# Patient Record
Sex: Male | Born: 1940 | Race: White | Hispanic: No | Marital: Single | Smoking: Current every day smoker
Health system: Southern US, Community
[De-identification: ages and names within clinical notes are randomized; demographics above are authoritative.]

## PROBLEM LIST (undated history)

## (undated) DIAGNOSIS — K7460 Unspecified cirrhosis of liver: Secondary | ICD-10-CM

## (undated) DIAGNOSIS — K635 Polyp of colon: Secondary | ICD-10-CM

## (undated) DIAGNOSIS — K648 Other hemorrhoids: Secondary | ICD-10-CM

## (undated) DIAGNOSIS — B029 Zoster without complications: Secondary | ICD-10-CM

## (undated) DIAGNOSIS — K449 Diaphragmatic hernia without obstruction or gangrene: Secondary | ICD-10-CM

## (undated) DIAGNOSIS — M1990 Unspecified osteoarthritis, unspecified site: Secondary | ICD-10-CM

## (undated) DIAGNOSIS — K2940 Chronic atrophic gastritis without bleeding: Secondary | ICD-10-CM

## (undated) DIAGNOSIS — R188 Other ascites: Secondary | ICD-10-CM

## (undated) HISTORY — PX: INGUINAL HERNIA REPAIR: SUR1180

## (undated) HISTORY — DX: Other hemorrhoids: K64.8

## (undated) HISTORY — DX: Other ascites: R18.8

## (undated) HISTORY — DX: Diaphragmatic hernia without obstruction or gangrene: K44.9

## (undated) HISTORY — DX: Zoster without complications: B02.9

## (undated) HISTORY — DX: Unspecified osteoarthritis, unspecified site: M19.90

## (undated) HISTORY — DX: Chronic atrophic gastritis without bleeding: K29.40

## (undated) HISTORY — DX: Unspecified cirrhosis of liver: K74.60

## (undated) HISTORY — DX: Polyp of colon: K63.5

---

## 2004-02-11 DIAGNOSIS — K648 Other hemorrhoids: Secondary | ICD-10-CM

## 2004-02-11 DIAGNOSIS — K635 Polyp of colon: Secondary | ICD-10-CM

## 2004-02-11 DIAGNOSIS — K2940 Chronic atrophic gastritis without bleeding: Secondary | ICD-10-CM

## 2004-02-11 HISTORY — DX: Other hemorrhoids: K64.8

## 2004-02-11 HISTORY — DX: Polyp of colon: K63.5

## 2004-02-11 HISTORY — DX: Chronic atrophic gastritis without bleeding: K29.40

## 2004-04-02 ENCOUNTER — Ambulatory Visit: Payer: Self-pay | Admitting: Family Medicine

## 2004-04-16 ENCOUNTER — Ambulatory Visit: Payer: Self-pay | Admitting: Gastroenterology

## 2004-04-24 ENCOUNTER — Ambulatory Visit: Payer: Self-pay | Admitting: Gastroenterology

## 2004-05-15 ENCOUNTER — Ambulatory Visit: Payer: Self-pay | Admitting: Gastroenterology

## 2004-05-28 ENCOUNTER — Ambulatory Visit: Payer: Self-pay | Admitting: Gastroenterology

## 2004-06-17 ENCOUNTER — Ambulatory Visit: Payer: Self-pay | Admitting: Gastroenterology

## 2004-08-05 ENCOUNTER — Ambulatory Visit: Payer: Self-pay | Admitting: Gastroenterology

## 2009-04-02 ENCOUNTER — Telehealth: Payer: Self-pay | Admitting: Gastroenterology

## 2009-11-15 ENCOUNTER — Telehealth: Payer: Self-pay | Admitting: Gastroenterology

## 2010-01-28 ENCOUNTER — Emergency Department (HOSPITAL_COMMUNITY)
Admission: EM | Admit: 2010-01-28 | Discharge: 2010-01-28 | Payer: Self-pay | Source: Home / Self Care | Admitting: Family Medicine

## 2010-03-12 NOTE — Progress Notes (Signed)
Summary: Recall Colon  Phone Note Outgoing Call   Call placed by: Ashok Cordia RN,  April 02, 2009 3:41 PM Summary of Call: Chart for review.  Recall assessment for repeat colon.  Per Dr. Jarold Motto: Pt needs hemacult cards.  Discussed this with pt.  Pt states he will call back to set up a time to come for insturctions re stool collections. Initial call taken by: Ashok Cordia RN,  April 02, 2009 3:43 PM

## 2010-03-12 NOTE — Progress Notes (Signed)
Summary: Schedule IFOB  Phone Note Outgoing Call Call back at [facility] Phone [phone]   Call placed by: Harlow Mares CMA Duncan Dull),  November 15, 2009 3:27 PM Call placed to: Patient Summary of Call: per last phone note patient needs to do hemocults/IFOB i have left him a message to call back so I can order them.  Initial call taken by: Harlow Mares CMA Duncan Dull),  November 15, 2009 3:28 PM  Follow-up for Phone Call        Left a message on patients machine to call back. patietn needs Ifob per last phone note. I will mail a letter to let him know.  Follow-up by: Harlow Mares CMA Duncan Dull),  November 23, 2009 11:35 AM

## 2010-04-22 LAB — HERPES SIMPLEX VIRUS CULTURE: Culture: NOT DETECTED

## 2011-05-12 ENCOUNTER — Encounter: Payer: Self-pay | Admitting: Gastroenterology

## 2011-05-15 ENCOUNTER — Telehealth: Payer: Self-pay | Admitting: Gastroenterology

## 2011-05-15 NOTE — Telephone Encounter (Signed)
Chart on Dr Norval Gable desk.

## 2011-05-15 NOTE — Telephone Encounter (Signed)
Pt calls to report he received his Recall letter for his COLON, but he is taking care of his 71YO mother and he can't spare the time now. He requested the stool cards so he can get that out of the way. Explained to pt I have sent for his chart and will discuss the matter with Dr Jarold Motto and get back to him; he stated understanding.

## 2011-05-15 NOTE — Telephone Encounter (Signed)
Line busy, will try later.

## 2011-05-16 NOTE — Telephone Encounter (Signed)
Notified pt that Dr Jarold Motto rescheduled his COLON to next year; we will send him a letter. Pt stated understanding.

## 2012-05-25 ENCOUNTER — Encounter: Payer: Self-pay | Admitting: Gastroenterology

## 2012-10-10 ENCOUNTER — Ambulatory Visit (INDEPENDENT_AMBULATORY_CARE_PROVIDER_SITE_OTHER): Payer: Medicare PPO | Admitting: Family Medicine

## 2012-10-10 ENCOUNTER — Ambulatory Visit: Payer: Medicare PPO

## 2012-10-10 ENCOUNTER — Other Ambulatory Visit (HOSPITAL_COMMUNITY): Payer: Self-pay

## 2012-10-10 VITALS — BP 132/80 | HR 111 | Temp 98.8°F | Resp 18 | Ht 69.0 in | Wt 137.0 lb

## 2012-10-10 DIAGNOSIS — R609 Edema, unspecified: Secondary | ICD-10-CM

## 2012-10-10 DIAGNOSIS — R140 Abdominal distension (gaseous): Secondary | ICD-10-CM

## 2012-10-10 DIAGNOSIS — R141 Gas pain: Secondary | ICD-10-CM

## 2012-10-10 LAB — POCT URINALYSIS DIPSTICK
Blood, UA: NEGATIVE
Glucose, UA: NEGATIVE
Ketones, UA: 15
Leukocytes, UA: NEGATIVE
Nitrite, UA: NEGATIVE
Protein, UA: 30
Spec Grav, UA: 1.02
Urobilinogen, UA: 4
pH, UA: 7

## 2012-10-10 LAB — POCT UA - MICROSCOPIC ONLY
Crystals, Ur, HPF, POC: NEGATIVE
RBC, urine, microscopic: NEGATIVE
WBC, Ur, HPF, POC: NEGATIVE
Yeast, UA: NEGATIVE

## 2012-10-10 LAB — POCT CBC
Granulocyte percent: 76.4 %G (ref 37–80)
HCT, POC: 46 % (ref 43.5–53.7)
Hemoglobin: 15.1 g/dL (ref 14.1–18.1)
Lymph, poc: 1 (ref 0.6–3.4)
MCH, POC: 33.5 pg — AB (ref 27–31.2)
MCHC: 32.8 g/dL (ref 31.8–35.4)
MCV: 102.1 fL — AB (ref 80–97)
MID (cbc): 0.5 (ref 0–0.9)
MPV: 8.4 fL (ref 0–99.8)
POC Granulocyte: 4.5 (ref 2–6.9)
POC LYMPH PERCENT: 15.9 %L (ref 10–50)
POC MID %: 7.7 %M (ref 0–12)
Platelet Count, POC: 139 10*3/uL — AB (ref 142–424)
RBC: 4.51 M/uL — AB (ref 4.69–6.13)
RDW, POC: 14.9 %
WBC: 6.4 10*3/uL (ref 4.6–10.2)

## 2012-10-10 LAB — COMPREHENSIVE METABOLIC PANEL
ALT: 29 U/L (ref 0–53)
AST: 83 U/L — ABNORMAL HIGH (ref 0–37)
Albumin: 3.3 g/dL — ABNORMAL LOW (ref 3.5–5.2)
Alkaline Phosphatase: 137 U/L — ABNORMAL HIGH (ref 39–117)
BUN: 7 mg/dL (ref 6–23)
CO2: 30 mEq/L (ref 19–32)
Calcium: 8.7 mg/dL (ref 8.4–10.5)
Chloride: 100 mEq/L (ref 96–112)
Creat: 0.46 mg/dL — ABNORMAL LOW (ref 0.50–1.35)
Glucose, Bld: 117 mg/dL — ABNORMAL HIGH (ref 70–99)
Potassium: 4.4 mEq/L (ref 3.5–5.3)
Sodium: 137 mEq/L (ref 135–145)
Total Bilirubin: 1.5 mg/dL — ABNORMAL HIGH (ref 0.3–1.2)
Total Protein: 7.7 g/dL (ref 6.0–8.3)

## 2012-10-10 LAB — IFOBT (OCCULT BLOOD): IFOBT: NEGATIVE

## 2012-10-10 NOTE — Patient Instructions (Addendum)
CAT scan today. Start multiple vitamin (eg Centrum) daily Stop alcoholic beverages

## 2012-10-10 NOTE — Progress Notes (Signed)
Is a 72 year old gentleman who presents with abdominal distention and edema. He usually drinks 5-6 hard drinks a day and notices more than he should be drinking. He's had gradual distention of the abdomen associated with bowel urgency. This is been going on for over a month. Has had colonoscopy 3 years ago.  Objective: Alert and in no acute distress. Patient's muscle contour suggests chronic disease with loss of muscle mass. HEENT: No icterus or jaundice, patient has an upper plate, hearing is normal Neck: Supple no adenopathy or JVD Heart: Occasional irregular, no murmur or gallop Chest: Few bibasilar rales Abdomen: Moderate ascites with positive fluid wave, no liver or splenomegaly palpated. Liver percusses to about 6 cm in the mid clavicular line Extremities show 3+ edema bilaterally to a upper calf. He has good pulses in his feet. There no rashes [facility] reading (PRIMARY) by  Dr. Milus Glazier:  Acute abdominal series: .some dilated loops of bowel Results for orders placed in visit on 10/10/12  POCT CBC      Result Value Range   WBC 6.4  4.6 - 10.2 K/uL   Lymph, poc 1.0  0.6 - 3.4   POC LYMPH PERCENT 15.9  10 - 50 %L   MID (cbc) 0.5  0 - 0.9   POC MID % 7.7  0 - 12 %M   POC Granulocyte 4.5  2 - 6.9   Granulocyte percent 76.4  37 - 80 %G   RBC 4.51 (*) 4.69 - 6.13 M/uL   Hemoglobin 15.1  14.1 - 18.1 g/dL   HCT, POC 82.9  56.2 - 53.7 %   MCV 102.1 (*) 80 - 97 fL   MCH, POC 33.5 (*) 27 - 31.2 pg   MCHC 32.8  31.8 - 35.4 g/dL   RDW, POC 13.0     Platelet Count, POC 139 (*) 142 - 424 K/uL   MPV 8.4  0 - 99.8 fL  POCT UA - MICROSCOPIC ONLY      Result Value Range   WBC, Ur, HPF, POC neg     RBC, urine, microscopic neg     Bacteria, U Microscopic trace     Mucus, UA mod     Epithelial cells, urine per micros 0-2     Crystals, Ur, HPF, POC neg     Casts, Ur, LPF, POC broad waxy     Yeast, UA neg    POCT URINALYSIS DIPSTICK      Result Value Range   Color, UA orange     Clarity, UA  clear     Glucose, UA neg     Bilirubin, UA mod     Ketones, UA 15     Spec Grav, UA 1.020     Blood, UA neg     pH, UA 7.0     Protein, UA 30     Urobilinogen, UA 4.0     Nitrite, UA neg     Leukocytes, UA Negative     Assessment:  72 yo heavy drinker with bloating and diarrhea.    Plan:  Multiple vitamin CT scan today:  If no mass, start lasix 20 qd and recheck in a week.  Signed, Elvina Sidle, md  Addendum: Patient refuses both Lasix and CT scan at this point. He says he is to go home and get something to eat. I tried to persuade patient to have the CAT scan and point out that it may be some significant findings that could compromise his overall health,  but he prefers to go home and get the CAT scan later in the week.

## 2012-10-11 ENCOUNTER — Telehealth: Payer: Self-pay | Admitting: Radiology

## 2012-10-11 NOTE — Telephone Encounter (Signed)
Patient advised he will get a call about the scan tomorrow, he wants it scheduled now. I have advised him the scheduling office for [facility] is closed today for holiday. He states if he can not have it on Tuesday morning, he would like to go on Wednesday early morning instead. He needs to go early. Please

## 2012-10-11 NOTE — Telephone Encounter (Signed)
Patient is agreeable to CT scan, he prefers to go Tuesday early morning. Will you make sure this is taken care of? I am out of the office on Tuesday.

## 2012-10-12 ENCOUNTER — Telehealth: Payer: Self-pay

## 2012-10-12 NOTE — Telephone Encounter (Signed)
From OV note 10/10/12  "Plan: Multiple vitamin  CT scan today: If no mass, start lasix 20 qd and recheck in a week.  Signed,  Elvina Sidle, md  Addendum: Patient refuses both Lasix and CT scan at this point. He says he is to go home and get something to eat. I tried to persuade patient to have the CAT scan and point out that it may be some significant findings that could compromise his overall health, but he prefers to go home and get the CAT scan later in the week."  Has pt had CT?  Looks like Dr. Elbert Ewings wanted him to have this.  He previously refused the medication for swelling.  I can send this in for him, but pt will need to follow up in 1 wk as directed by Dr. Elbert Ewings in last note

## 2012-10-12 NOTE — Telephone Encounter (Signed)
PT STATES HE HAD SEEN THE DR AND WAS TO HAVE SOME MEDICINE CALLED IN FOR THE SWELLING OF HIS FEET AND THE PHARMACY HASN'T RECEIVED ANYTHING PLEASE CALL [phone]   [facility] [address]

## 2012-10-13 ENCOUNTER — Telehealth: Payer: Self-pay

## 2012-10-13 NOTE — Telephone Encounter (Signed)
Gerald Kennedy, she is trying to get precert for the scan.

## 2012-10-13 NOTE — Telephone Encounter (Signed)
Pt wants to know when the appointment for the scan will be.  Has contacted [place] previously wanting an early morning appointment.Pt is frustrated with the process. Was told to call and speak with Dr.L. Call at [phone].

## 2012-10-13 NOTE — Telephone Encounter (Signed)
Will you help him?

## 2012-10-14 NOTE — Telephone Encounter (Signed)
Ok great thanks - let's get the CT and see what that shows first, then can send in Lasix 20mg  qd as indicated by Dr. Elbert Ewings in his note.  Will need close follow up though

## 2012-10-14 NOTE — Telephone Encounter (Signed)
Where are we with scheduling of the CT scan?

## 2012-10-15 ENCOUNTER — Telehealth: Payer: Self-pay | Admitting: Radiology

## 2012-10-15 NOTE — Telephone Encounter (Signed)
The insurance company will not authorize the CT scan without patients past medical history, he is new to our office, and there is no documentation of his PMH in the computer. Do you recall from the office visit what his past history is? Please advise, so I can send the information to [facility] to get authorization for the CT scan. Please advise

## 2012-10-15 NOTE — Telephone Encounter (Signed)
Spoke with patient and notified CT has been cancelled per Amy. Waiting for past medical history approval from insurance. See previous message.

## 2012-10-16 ENCOUNTER — Telehealth: Payer: Self-pay

## 2012-10-16 NOTE — Telephone Encounter (Signed)
Pt called to talk about his CT scan. He wants to see if Dr Milus Glazier can call [facility] on Monday morning to help speed this process up. He is wanting to get this scan done asap. The number is [phone]

## 2012-10-18 ENCOUNTER — Telehealth: Payer: Self-pay

## 2012-10-18 ENCOUNTER — Ambulatory Visit (HOSPITAL_COMMUNITY): Payer: Medicare PPO

## 2012-10-18 NOTE — Telephone Encounter (Signed)
PT WAS TO GIVE AMY A CALL BACK TODAY AND HE CAN SPEAK WITH HER OR AMY. PLEASE CALL [phone]

## 2012-10-18 NOTE — Telephone Encounter (Signed)
Can you advise on his past medical history, and put an addendum to last visit so I can get authorization for his CT scan?

## 2012-10-18 NOTE — Telephone Encounter (Signed)
We need to make sure they know the information Dr Milus Glazier has presented. I am in a meeting tomorrow am, will you work on this?

## 2012-10-18 NOTE — Telephone Encounter (Signed)
Patient has ongoing diffuse abdominal pain and distension.  He has a h/o excessive alcohol use.

## 2012-10-21 ENCOUNTER — Other Ambulatory Visit: Payer: Self-pay | Admitting: Family Medicine

## 2012-10-21 ENCOUNTER — Ambulatory Visit
Admission: RE | Admit: 2012-10-21 | Discharge: 2012-10-21 | Disposition: A | Discharge status: Home / Self Care | Payer: Medicare PPO | Source: Ambulatory Visit | Attending: Family Medicine | Admitting: Family Medicine

## 2012-10-21 DIAGNOSIS — K7460 Unspecified cirrhosis of liver: Secondary | ICD-10-CM

## 2012-10-21 DIAGNOSIS — R188 Other ascites: Secondary | ICD-10-CM

## 2012-10-21 DIAGNOSIS — R140 Abdominal distension (gaseous): Secondary | ICD-10-CM

## 2012-10-21 MED ORDER — SPIRONOLACTONE 50 MG PO TABS: 50.0000 mg | ORAL_TABLET | Freq: Every day | ORAL | Status: DC

## 2012-10-21 MED ORDER — FUROSEMIDE 20 MG PO TABS: 20.0000 mg | ORAL_TABLET | Freq: Every day | ORAL | Status: DC

## 2012-10-21 MED ORDER — IOHEXOL 300 MG/ML  SOLN
100.0000 mL | Freq: Once | INTRAMUSCULAR | Status: AC | PRN
  Administered 2012-10-21: 100 mL via INTRAVENOUS

## 2012-10-22 NOTE — Telephone Encounter (Signed)
Pt would like to speak with you regarding an appt, when asked did he want to speak with the appt building to schedule an appt he stated that he only wanted to speak with you about this. [phone]

## 2012-10-25 ENCOUNTER — Ambulatory Visit (INDEPENDENT_AMBULATORY_CARE_PROVIDER_SITE_OTHER): Payer: Medicare PPO | Admitting: Family Medicine

## 2012-10-25 VITALS — BP 120/80 | HR 94 | Temp 98.1°F | Resp 16 | Ht 69.0 in | Wt 129.4 lb

## 2012-10-25 DIAGNOSIS — Z23 Encounter for immunization: Secondary | ICD-10-CM

## 2012-10-25 DIAGNOSIS — K7460 Unspecified cirrhosis of liver: Secondary | ICD-10-CM

## 2012-10-25 DIAGNOSIS — R188 Other ascites: Secondary | ICD-10-CM

## 2012-10-25 MED ORDER — FUROSEMIDE 20 MG PO TABS: 20.0000 mg | ORAL_TABLET | Freq: Every day | ORAL | Status: DC

## 2012-10-25 MED ORDER — SPIRONOLACTONE 50 MG PO TABS: 50.0000 mg | ORAL_TABLET | Freq: Every day | ORAL | Status: DC

## 2012-10-25 NOTE — Progress Notes (Signed)
72 yo ex-heavy drinker with cirrhosis who has ascites and esophageal varices.  He was prescribed fluid pills and has lost 8 lbs.  Objective:  NAD NEck:  No JVD Chest:  Clear Heart:  occas irreg, no murmur Abdomen:  Fluid wave.  No masses Extremity:  2+ pedal edema  Assessment:  Improved ascites  Plan:  GI consult  Continue the Centrum Continue the fluid pills  Signed,  Elvina Sidle, MD

## 2012-10-25 NOTE — Telephone Encounter (Signed)
Patient needs to talk with dr. Milus Glazier or his nurse regarding his liver problem.   [phone]

## 2012-10-25 NOTE — Telephone Encounter (Signed)
Patient advised. He wants to discuss with Dr Milus Glazier. He is going to come in [place].

## 2012-11-02 NOTE — Telephone Encounter (Signed)
CT was done on 10/21/12.

## 2012-11-05 ENCOUNTER — Encounter: Payer: Self-pay | Admitting: Gastroenterology

## 2012-11-24 ENCOUNTER — Encounter: Payer: Self-pay | Admitting: *Deleted

## 2012-12-02 ENCOUNTER — Encounter: Payer: Self-pay | Admitting: Gastroenterology

## 2012-12-02 ENCOUNTER — Other Ambulatory Visit (INDEPENDENT_AMBULATORY_CARE_PROVIDER_SITE_OTHER): Payer: Medicare PPO

## 2012-12-02 ENCOUNTER — Ambulatory Visit (INDEPENDENT_AMBULATORY_CARE_PROVIDER_SITE_OTHER): Payer: Medicare PPO | Admitting: Gastroenterology

## 2012-12-02 VITALS — BP 124/84 | HR 97 | Ht 67.75 in | Wt 124.2 lb

## 2012-12-02 DIAGNOSIS — R918 Other nonspecific abnormal finding of lung field: Secondary | ICD-10-CM

## 2012-12-02 DIAGNOSIS — J42 Unspecified chronic bronchitis: Secondary | ICD-10-CM

## 2012-12-02 DIAGNOSIS — F1010 Alcohol abuse, uncomplicated: Secondary | ICD-10-CM

## 2012-12-02 DIAGNOSIS — K7460 Unspecified cirrhosis of liver: Secondary | ICD-10-CM

## 2012-12-02 DIAGNOSIS — D7589 Other specified diseases of blood and blood-forming organs: Secondary | ICD-10-CM

## 2012-12-02 DIAGNOSIS — R9389 Abnormal findings on diagnostic imaging of other specified body structures: Secondary | ICD-10-CM

## 2012-12-02 DIAGNOSIS — K5900 Constipation, unspecified: Secondary | ICD-10-CM

## 2012-12-02 LAB — CBC WITH DIFFERENTIAL/PLATELET
Basophils Relative: 0.2 % (ref 0.0–3.0)
Eosinophils Absolute: 0.1 10*3/uL (ref 0.0–0.7)
Hemoglobin: 14.3 g/dL (ref 13.0–17.0)
Lymphs Abs: 1.2 10*3/uL (ref 0.7–4.0)
MCHC: 34.5 g/dL (ref 30.0–36.0)
MCV: 95.2 fl (ref 78.0–100.0)
Monocytes Absolute: 0.5 10*3/uL (ref 0.1–1.0)
Neutro Abs: 4.5 10*3/uL (ref 1.4–7.7)
RBC: 4.34 Mil/uL (ref 4.22–5.81)
RDW: 15.6 % — ABNORMAL HIGH (ref 11.5–14.6)

## 2012-12-02 LAB — HEPATIC FUNCTION PANEL
ALT: 19 U/L (ref 0–53)
AST: 42 U/L — ABNORMAL HIGH (ref 0–37)
Alkaline Phosphatase: 100 U/L (ref 39–117)
Total Bilirubin: 0.8 mg/dL (ref 0.3–1.2)

## 2012-12-02 LAB — BASIC METABOLIC PANEL
BUN: 6 mg/dL (ref 6–23)
Calcium: 9.5 mg/dL (ref 8.4–10.5)
Creatinine, Ser: 0.6 mg/dL (ref 0.4–1.5)
GFR: 135.5 mL/min (ref >60.00)

## 2012-12-02 LAB — TSH: TSH: 1.98 u[IU]/mL (ref 0.35–5.50)

## 2012-12-02 LAB — IBC PANEL: Saturation Ratios: 24.9 % (ref 20.0–50.0)

## 2012-12-02 MED ORDER — FOLIC ACID 1 MG PO TABS: 1.0000 mg | ORAL_TABLET | Freq: Every day | ORAL | Status: DC

## 2012-12-02 NOTE — Patient Instructions (Addendum)
You have been scheduled for an endoscopy with propofol. Please follow written instructions given to you at your visit today. If you use inhalers (even only as needed), please bring them with you on the day of your procedure. Your physician has requested that you go to www.startemmi.com and enter the access code given to you at your visit today. This web site gives a general overview about your procedure. However, you should still follow specific instructions given to you by our office regarding your preparation for the procedure.   Please purchase Miralax over the counter and mix 8 ounces in juice or water at bedtime.   Please purchase Benefiber over the counter and take one tablespoon by mouth twice daily.   We have sent the following medications to your pharmacy for you to pick up at your convenience: Folic Acid 1 mg, please take one tablet by mouth once daily   Please continue your dieretics.  Your physician has requested that you go to the basement for lab work before leaving today.

## 2012-12-02 NOTE — Progress Notes (Signed)
History of Present Illness:  This is a 72 year old chronic alcoholic there also is a chronic smoker with COPD who presented recently to his primary care physician with ascites.  He had CT scan of the abdomen September 11 that showed ascites in irregular nodular liver consistent with cirrhosis and possible hepatoma in the right lobe.  Placed on Lasix 40 mg a day and spironolactone 50 mg a day with resolution of his ascites.  His only other symptomatology his gas, bloating, and chronic constipation which i which has been present for many years.  He actually had sclerotherapy of his hemorrhoids by Dr. Lebron Conners in may of 2006.  Last colonoscopy and endoscopy was in 2006.  At that time he was positive for H. pylori gastritis and was treated with a Prevpac.  I have not seen this patient since 2006.  His ascites seemed to resolve and he denies abdominal pain, hematemesis, melena, or other specific gastrointestinal or general medical problems.  He does have COPD with limited exercise tolerance but denies a history of MIs or CVAs.  Is not on any anticoagulation agents or other medications.  He says he's quit drinking alcohol in his and is not going to AA.  He denies a previous history of hepatitis or family history of liver disease.  He denies psychiatric difficulties.  Recent hemoglobin was 15 with a normal indices except for an elevated MCV.  Liver function tests were consistent with alcoholic hepatitis with SGOT 83, SGPT 29, phosphatase 137, albumin 3.3 g percent and normal albumin.  I have reviewed this patient's present history, medical and surgical past history, allergies and medications.     ROS:   All systems were reviewed and are negative unless otherwise stated in the HPI.    Physical Exam: Blood pressure 124/84, pulse 97 and regular and weight 03/01/2012 919.83. General well developed well nourished patient in no acute distress, appearing their stated age Eyes PERRLA, no icterus, fundoscopic exam  per opthamologist Skin no lesions noted Neck supple, no adenopathy, no thyroid enlargement, no tenderness Chest diminished breath sounds in both lung fields with loud rhonchi anteriorly. Heart no significant murmurs, gallops or rubs noted Abdomen enlarged lobular right lobe of the liver which is nontender.  I cannot appreciate enlarged left lobe the liver, splenomegaly, or evidence of ascites or peripheral edema. Rectal inspection normal no fissures, or fistulae noted.  No masses or tenderness on digital exam. Stool guaiac negative.  Perianal and posterior skin tags noted without definite fissures or fistulae or any hemorrhoids.  Stool is normal color and guaiac-negative. Extremities no acute joint lesions, edema, phlebitis or evidence of cellulitis. Neurologic patient oriented x 3, cranial nerves intact, no focal neurologic deficits noted. Psychological mental status normal and normal affect.  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  Repeat labs ordered including hepatitis B. surface antigen, hepatitis C antibody, prothrombin time, alpha-fetoprotein, anemia profile, and repeat liver enzymes.  I've scheduled endoscopy to check for varices.  He probably will need MRI exam of his liver depending on his alpha-fetoprotein level.  I've strongly advised him to avoid alcohol at all costs.  He also has rather severe COPD will probably need pulmonary function testing if he is to be considered for liver transplantation in the future.  Last endoscopy in 2006 showed no evidence of varices, and he had a small hyperplastic colon polyp removed.  He probably has mild probably south anemia of related to his smoking and COPD.  So  ordered a day prothrombin time, ammonia level, and CDT.  For his constipation I placed him on twice a day Benefiber with his food and MiraLax at bedtime.  He may need elevation in his diuretic therapy depending on his clinical course.  Once this patient  is been worked up and stabilized I will turn him over to one of our physicians for long-term care since I will be retiring in several months.  Again, MRI may be needed and liver biopsy may be needed.

## 2012-12-02 NOTE — Addendum Note (Signed)
Addended by: Ok Anis A on: 12/02/2012 01:56 PM   Modules accepted: Orders

## 2012-12-03 LAB — AFP TUMOR MARKER: AFP-Tumor Marker: 5 ng/mL (ref 0.0–8.0)

## 2012-12-03 LAB — HEPATITIS B SURFACE ANTIGEN: Hepatitis B Surface Ag: NEGATIVE

## 2012-12-03 LAB — HEPATITIS C ANTIBODY: HCV Ab: NEGATIVE

## 2012-12-05 LAB — CARBOHYDRATE DEFICIENT TRANSFERRIN: CDT: 50.8 mg/L (ref 28.1–76.0)

## 2012-12-08 ENCOUNTER — Encounter: Payer: Self-pay | Admitting: Family Medicine

## 2012-12-08 ENCOUNTER — Ambulatory Visit (INDEPENDENT_AMBULATORY_CARE_PROVIDER_SITE_OTHER): Payer: Medicare PPO | Admitting: Family Medicine

## 2012-12-08 VITALS — BP 108/62 | HR 100 | Temp 98.3°F | Resp 16 | Ht 69.0 in | Wt 126.4 lb

## 2012-12-08 DIAGNOSIS — M1990 Unspecified osteoarthritis, unspecified site: Secondary | ICD-10-CM

## 2012-12-08 DIAGNOSIS — K7460 Unspecified cirrhosis of liver: Secondary | ICD-10-CM

## 2012-12-08 DIAGNOSIS — M129 Arthropathy, unspecified: Secondary | ICD-10-CM

## 2012-12-08 MED ORDER — PREDNISONE 10 MG PO TABS: ORAL_TABLET | ORAL | Status: DC

## 2012-12-08 NOTE — Progress Notes (Signed)
72 yo retired man with ascites and in the process of being evaluated by Dr. Jarold Motto.  His bowels have become more regular and he is going regularly since the fiber and miralax added to his regimen.  He has stopped drinking alcohol.  He complains of pain in fingers, toes.  Ibuprofen and other OTC NSAID's not helping  Objective:  NAD, somewhat cachectic Abdomen:  Markedly decreased swelling, some fullness in epigastrium, nontender, no guarding, no rebound, normal BS, no bruit Neck:  No adenop, supple, no thyromegaly, no bruit Chest:  Clear Heart:  Very occasionally ireg, no murmur Ext: no edema. Skin:  No jaundice  Results for orders placed in visit on 12/02/12  HEPATIC FUNCTION PANEL      Result Value Range   Total Bilirubin 0.8  0.3 - 1.2 mg/dL   Bilirubin, Direct 0.3  0.0 - 0.3 mg/dL   Alkaline Phosphatase 100  39 - 117 U/L   AST 42 (*) 0 - 37 U/L   ALT 19  0 - 53 U/L   Total Protein 8.4 (*) 6.0 - 8.3 g/dL   Albumin 3.5  3.5 - 5.2 g/dL  VITAMIN Z61      Result Value Range   Vitamin B-12 337  211 - 911 pg/mL  FERRITIN      Result Value Range   Ferritin 49.9  22.0 - 322.0 ng/mL  CBC WITH DIFFERENTIAL      Result Value Range   WBC 6.3  4.5 - 10.5 K/uL   RBC 4.34  4.22 - 5.81 Mil/uL   Hemoglobin 14.3  13.0 - 17.0 g/dL   HCT 09.6  04.5 - 40.9 %   MCV 95.2  78.0 - 100.0 fl   MCHC 34.5  30.0 - 36.0 g/dL   RDW 81.1 (*) 91.4 - 78.2 %   Platelets 133.0 (*) 150.0 - 400.0 K/uL   Neutrophils Relative % 72.2  43.0 - 77.0 %   Lymphocytes Relative 18.4  12.0 - 46.0 %   Monocytes Relative 7.7  3.0 - 12.0 %   Eosinophils Relative 1.5  0.0 - 5.0 %   Basophils Relative 0.2  0.0 - 3.0 %   Neutro Abs 4.5  1.4 - 7.7 K/uL   Lymphs Abs 1.2  0.7 - 4.0 K/uL   Monocytes Absolute 0.5  0.1 - 1.0 K/uL   Eosinophils Absolute 0.1  0.0 - 0.7 K/uL   Basophils Absolute 0.0  0.0 - 0.1 K/uL  BASIC METABOLIC PANEL      Result Value Range   Sodium 135  135 - 145 mEq/L   Potassium 3.8  3.5 - 5.1 mEq/L    Chloride 98  96 - 112 mEq/L   CO2 28  19 - 32 mEq/L   Glucose, Bld 105 (*) 70 - 99 mg/dL   BUN 6  6 - 23 mg/dL   Creatinine, Ser 0.6  0.4 - 1.5 mg/dL   Calcium 9.5  8.4 - 95.6 mg/dL   GFR 213.08  >65.78 mL/min  TSH      Result Value Range   TSH 1.98  0.35 - 5.50 uIU/mL  FOLATE      Result Value Range   Folate 9.7  >5.9 ng/mL  IBC PANEL      Result Value Range   Iron 95  42 - 165 ug/dL   Transferrin 469.6  295.2 - 360.0 mg/dL   Saturation Ratios 84.1  20.0 - 50.0 %  PROTIME-INR      Result Value  Range   Prothrombin Time 13.3 (*) 10.2 - 12.4 sec   INR 1.3 (*) 0.8 - 1.0 ratio  HEPATITIS B SURFACE ANTIGEN      Result Value Range   Hepatitis B Surface Ag NEGATIVE  NEGATIVE  HEPATITIS C ANTIBODY      Result Value Range   HCV Ab NEGATIVE  NEGATIVE  AFP TUMOR MARKER      Result Value Range   AFP-Tumor Marker 5.0  0.0 - 8.0 ng/mL  AMMONIA      Result Value Range   Ammonia 8 (*) 11 - 35 umol/L  CARBOHYDRATE DEFICIENT TRANSFERRIN      Result Value Range   CDT 50.8  28.1 - 76.0 mg/L   Transferrin 267  188 - 341 mg/dL   %CDT 1.9  <1.6 %   Assessment:  Impressive improvement in ascites, mild to moderate osteoarthritis.  Overall, patient has stabilized and the liver function is improving.  He is compliant and avoiding alcohol, so I am optimistic about his chances for recovery.  Arthritis - Plan: predniSONE (DELTASONE) 10 MG tablet  Cirrhosis of liver  Follow up with GI for endoscopy, return here in January.  Signed, Elvina Sidle, MD

## 2012-12-08 NOTE — Patient Instructions (Signed)
Results for orders placed in visit on 12/02/12  HEPATIC FUNCTION PANEL      Result Value Range   Total Bilirubin 0.8  0.3 - 1.2 mg/dL   Bilirubin, Direct 0.3  0.0 - 0.3 mg/dL   Alkaline Phosphatase 100  39 - 117 U/L   AST 42 (*) 0 - 37 U/L   ALT 19  0 - 53 U/L   Total Protein 8.4 (*) 6.0 - 8.3 g/dL   Albumin 3.5  3.5 - 5.2 g/dL  VITAMIN I69      Result Value Range   Vitamin B-12 337  211 - 911 pg/mL  FERRITIN      Result Value Range   Ferritin 49.9  22.0 - 322.0 ng/mL  CBC WITH DIFFERENTIAL      Result Value Range   WBC 6.3  4.5 - 10.5 K/uL   RBC 4.34  4.22 - 5.81 Mil/uL   Hemoglobin 14.3  13.0 - 17.0 g/dL   HCT 62.9  52.8 - 41.3 %   MCV 95.2  78.0 - 100.0 fl   MCHC 34.5  30.0 - 36.0 g/dL   RDW 24.4 (*) 01.0 - 27.2 %   Platelets 133.0 (*) 150.0 - 400.0 K/uL   Neutrophils Relative % 72.2  43.0 - 77.0 %   Lymphocytes Relative 18.4  12.0 - 46.0 %   Monocytes Relative 7.7  3.0 - 12.0 %   Eosinophils Relative 1.5  0.0 - 5.0 %   Basophils Relative 0.2  0.0 - 3.0 %   Neutro Abs 4.5  1.4 - 7.7 K/uL   Lymphs Abs 1.2  0.7 - 4.0 K/uL   Monocytes Absolute 0.5  0.1 - 1.0 K/uL   Eosinophils Absolute 0.1  0.0 - 0.7 K/uL   Basophils Absolute 0.0  0.0 - 0.1 K/uL  BASIC METABOLIC PANEL      Result Value Range   Sodium 135  135 - 145 mEq/L   Potassium 3.8  3.5 - 5.1 mEq/L   Chloride 98  96 - 112 mEq/L   CO2 28  19 - 32 mEq/L   Glucose, Bld 105 (*) 70 - 99 mg/dL   BUN 6  6 - 23 mg/dL   Creatinine, Ser 0.6  0.4 - 1.5 mg/dL   Calcium 9.5  8.4 - 53.6 mg/dL   GFR 644.03  >47.42 mL/min  TSH      Result Value Range   TSH 1.98  0.35 - 5.50 uIU/mL  FOLATE      Result Value Range   Folate 9.7  >5.9 ng/mL  IBC PANEL      Result Value Range   Iron 95  42 - 165 ug/dL   Transferrin 595.6  387.5 - 360.0 mg/dL   Saturation Ratios 64.3  20.0 - 50.0 %  PROTIME-INR      Result Value Range   Prothrombin Time 13.3 (*) 10.2 - 12.4 sec   INR 1.3 (*) 0.8 - 1.0 ratio  HEPATITIS B SURFACE ANTIGEN      Result Value Range   Hepatitis B Surface Ag NEGATIVE  NEGATIVE  HEPATITIS C ANTIBODY      Result Value Range   HCV Ab NEGATIVE  NEGATIVE  AFP TUMOR MARKER      Result Value Range   AFP-Tumor Marker 5.0  0.0 - 8.0 ng/mL  AMMONIA      Result Value Range   Ammonia 8 (*) 11 - 35 umol/L  CARBOHYDRATE DEFICIENT TRANSFERRIN  Result Value Range   CDT 50.8  28.1 - 76.0 mg/L   Transferrin 267  188 - 341 mg/dL   %CDT 1.9  <4.0 %

## 2012-12-13 ENCOUNTER — Telehealth: Payer: Self-pay | Admitting: Gastroenterology

## 2012-12-13 NOTE — Telephone Encounter (Signed)
No ...set f/u with AMY

## 2012-12-14 NOTE — Telephone Encounter (Signed)
He needs some point MRI of his abdomen, but is unlikely that he has a hepatoma with a normal alpha-fetoprotein. Review of his CT scan does show that he has varices and ascites. I should transfer this patient to Dr. Rhea Belton since he is going to need long-term care and possibly variceal banding. Send a copy this note to Dr. Faustino Congress.

## 2012-12-14 NOTE — Telephone Encounter (Signed)
OK to you; will you accept the pt? Thanks.

## 2012-12-14 NOTE — Telephone Encounter (Signed)
Spoke with pt to r/s an appt and he stated he was sorry he had to cancel, but he has too much on his plate. Informed him he will not be charged for cancelling the procedure and call me back when he is ready to schedule. Dr Jarold Motto, pt states Dr Milus Glazier wants to talk to you about pt.

## 2012-12-14 NOTE — Telephone Encounter (Signed)
Yes, I accept, but would get patient in to see Amy 1st, then can follow with me thereafter (this should be quicker than awaiting on my next available appt) Thanks

## 2012-12-14 NOTE — Telephone Encounter (Signed)
No ,,,f/u with Amy

## 2012-12-15 ENCOUNTER — Encounter: Payer: Medicare PPO | Admitting: Gastroenterology

## 2012-12-30 ENCOUNTER — Telehealth: Payer: Self-pay

## 2012-12-30 DIAGNOSIS — M1990 Unspecified osteoarthritis, unspecified site: Secondary | ICD-10-CM

## 2012-12-30 MED ORDER — MELOXICAM 7.5 MG PO TABS: 7.5000 mg | ORAL_TABLET | Freq: Every day | ORAL | Status: DC

## 2012-12-30 NOTE — Telephone Encounter (Signed)
What medication is he requesting? He is asking for prednisone, but I have advised him it is not a good idea to repeat course of this due to side effects. He indicates he is using acetaminophen, advised him with his liver he should not take this. Advised him I will ask Dr Milus Glazier to recommend an NSAID for him, please advise. He indicates he has arthritis pains, worse with the cold.

## 2012-12-30 NOTE — Telephone Encounter (Signed)
Patient would like to know if he can have a refill on his arthritis medication (he knows he has no refills) please call patient at [phone]

## 2012-12-31 NOTE — Telephone Encounter (Signed)
Informed pt Dr Margretta Sidle is glad to accept him as a new pt. Pt states he can't schedule right now. Informed him to call when he wants to be seen and if Dr Rhea Belton doesn't have an appt, he may see an extender. Pt stated understanding.

## 2012-12-31 NOTE — Telephone Encounter (Signed)
Pt advised Meloxicam sent in for him

## 2013-01-17 ENCOUNTER — Telehealth: Payer: Self-pay

## 2013-01-17 DIAGNOSIS — R188 Other ascites: Secondary | ICD-10-CM

## 2013-01-17 MED ORDER — SPIRONOLACTONE 50 MG PO TABS: 50.0000 mg | ORAL_TABLET | Freq: Every day | ORAL | Status: DC

## 2013-01-17 NOTE — Telephone Encounter (Signed)
PT STATES HE WOULD LIKE A REFILL ON HIS ALDACTONE, PLEASE CALL [phone] AND HE WASN'T SURE HOW TO CALL THE PHARMACY   [facility] [address]

## 2013-01-23 ENCOUNTER — Other Ambulatory Visit: Payer: Self-pay | Admitting: Family Medicine

## 2013-02-23 ENCOUNTER — Ambulatory Visit: Payer: Medicare PPO | Admitting: Family Medicine

## 2013-03-10 ENCOUNTER — Ambulatory Visit (INDEPENDENT_AMBULATORY_CARE_PROVIDER_SITE_OTHER): Payer: Medicare PPO | Admitting: Family Medicine

## 2013-03-10 ENCOUNTER — Encounter: Payer: Self-pay | Admitting: Family Medicine

## 2013-03-10 VITALS — BP 120/80 | HR 75 | Temp 98.1°F | Resp 16 | Ht 68.0 in | Wt 136.8 lb

## 2013-03-10 DIAGNOSIS — K7030 Alcoholic cirrhosis of liver without ascites: Secondary | ICD-10-CM

## 2013-03-10 DIAGNOSIS — M1990 Unspecified osteoarthritis, unspecified site: Secondary | ICD-10-CM

## 2013-03-10 DIAGNOSIS — R188 Other ascites: Secondary | ICD-10-CM

## 2013-03-10 MED ORDER — SPIRONOLACTONE 50 MG PO TABS: 50.0000 mg | ORAL_TABLET | Freq: Every day | ORAL | Status: AC

## 2013-03-10 MED ORDER — FUROSEMIDE 20 MG PO TABS: 20.0000 mg | ORAL_TABLET | Freq: Every day | ORAL | Status: AC

## 2013-03-10 MED ORDER — MELOXICAM 7.5 MG PO TABS: 7.5000 mg | ORAL_TABLET | Freq: Every day | ORAL | Status: AC

## 2013-03-10 NOTE — Progress Notes (Signed)
Subjective:  This chart was scribed for Gerald Sidle, MD by Gerald Kennedy, Medical Scribe. This patient was seen in Room 24 and the patient's care was started at 10:14 AM.   Patient ID: Gerald Kennedy, male    DOB: Aug 07, 1940, 73 y.o.   MRN: [id number]  HPI HPI Comments: Gerald Kennedy is a 73 y.o. male with a history of Arthritis and Cirrhosis who presents to the [facility] for a follow-up appointment.  The patient states that he has gained about 8 pounds recently and is eating more.  The patient states that he will have medium stools of normal consistency every morning but his urine does not smell the same.  He denies drinking anymore but states that he is still smoking.  He states that he has cut his smoking down to about half and smokes outside.  The patient states that the Lasix is helping the intermittent swelling she experiences in his feet.  The patient states that he is no longer taking Folvite. The patient states that he used to see Dr. Jarold Kennedy but he has since retired.  He denies seeing Dr. Norval Kennedy colleague.    The patient is also complaining of constant joint pain in his knees, legs, and feet.  The patient states that sitting down for long periods of time aggravates his joint pain.  He suspects that he will be using a walker by the springtime.  He states that he bumps into things while walking.    The patient states that he is currently experiencing a lot of gas but takes Tums with complete relief to his symptoms.    The patient states that he does not want a flu shot.  He states that his wife is seeing Dr. Orvan Kennedy.    Past Medical History  Diagnosis Date  . Cirrhosis     CT  . Hiatal hernia     CT  . Other ascites   . 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 Internal hemorrhoids without mention of complication 2006  . Hyperplastic colon polyp 2006  . Arthritis   . Shingles    Past Surgical History  Procedure Laterality Date    . Inguinal hernia repair Left     rupture   Family History  Problem Relation Age of Onset  . Stroke Mother   . Heart disease Father    History   Social History  . Marital Status: Single    Spouse Name: N/A    Number of Children: 3  . Years of Education: N/A   Occupational History  .     Social History Main Topics  . Smoking status: Current Every Day Smoker -- 1.00 packs/day    Types: Cigarettes  . Smokeless tobacco: Never Used  . Alcohol Use: No     Comment: Former heavy drinker   . Drug Use: No  . Sexual Activity: Not on file   Other Topics Concern  . Not on file   Social History Narrative  . No narrative on file   No Known Allergies   Review of Systems  Musculoskeletal: Positive for gait problem (wide-base gait).  All other systems reviewed and are negative.     Objective:  Physical Exam  Nursing note and vitals reviewed. Constitutional: He is oriented to person, place, and time. He appears well-developed and well-nourished. No distress.  HENT:  Head: Normocephalic and atraumatic.  Eyes: EOM are normal.  Neck: Normal range of motion.  Cardiovascular: Normal  rate, regular rhythm, normal heart sounds and intact distal pulses.  Exam reveals no gallop and no friction rub.   No murmur heard. Pulmonary/Chest: Effort normal. No respiratory distress. He has no wheezes. He has no rales.  Abdominal: Soft. He exhibits no mass. There is no tenderness. There is no rebound and no guarding.  Musculoskeletal: Normal range of motion.  Neurological: He is alert and oriented to person, place, and time.  Wide-base gait.   Skin: Skin is warm and dry. No rash noted. No erythema.  Psychiatric: He has a normal mood and affect. His behavior is normal.       BP 120/80  Pulse 75  Temp(Src) 98.1 F (36.7 C) (Oral)  Resp 16  Ht 5\' 8"  (1.727 m)  Wt 136 lb 12.8 oz (62.052 kg)  BMI 20.81 kg/m2  SpO2 94% Assessment & Plan:    I personally performed the services  described in this documentation, which was scribed in my presence. The recorded information has been reviewed and is accurate.  Alcoholic cirrhosis of liver - Plan: spironolactone (ALDACTONE) 50 MG tablet  Osteoarthritis - Plan: meloxicam (MOBIC) 7.5 MG tablet  Ascites - Plan: furosemide (LASIX) 20 MG tablet, spironolactone (ALDACTONE) 50 MG tablet  Signed, Gerald SidleKurt Miloh Alcocer, MD

## 2013-06-09 ENCOUNTER — Ambulatory Visit: Payer: Medicare PPO | Admitting: Family Medicine

## 2013-06-27 ENCOUNTER — Encounter: Payer: Self-pay | Admitting: Family Medicine

## 2013-06-27 ENCOUNTER — Ambulatory Visit (INDEPENDENT_AMBULATORY_CARE_PROVIDER_SITE_OTHER): Payer: Medicare PPO | Admitting: Family Medicine

## 2013-06-27 VITALS — BP 140/80 | HR 84 | Temp 98.6°F | Resp 16 | Ht 68.0 in | Wt 139.4 lb

## 2013-06-27 DIAGNOSIS — M1990 Unspecified osteoarthritis, unspecified site: Secondary | ICD-10-CM

## 2013-06-27 DIAGNOSIS — M129 Arthropathy, unspecified: Secondary | ICD-10-CM

## 2013-06-27 DIAGNOSIS — K7460 Unspecified cirrhosis of liver: Secondary | ICD-10-CM

## 2013-06-27 LAB — COMPREHENSIVE METABOLIC PANEL
ALT: 19 U/L (ref 0–53)
AST: 44 U/L — ABNORMAL HIGH (ref 0–37)
Albumin: 4 g/dL (ref 3.5–5.2)
Alkaline Phosphatase: 95 U/L (ref 39–117)
BUN: 8 mg/dL (ref 6–23)
CO2: 27 mEq/L (ref 19–32)
Calcium: 9.6 mg/dL (ref 8.4–10.5)
Chloride: 99 mEq/L (ref 96–112)
Creat: 0.66 mg/dL (ref 0.50–1.35)
Glucose, Bld: 106 mg/dL — ABNORMAL HIGH (ref 70–99)
Potassium: 4.3 mEq/L (ref 3.5–5.3)
Sodium: 137 mEq/L (ref 135–145)
Total Bilirubin: 0.9 mg/dL (ref 0.2–1.2)
Total Protein: 8.2 g/dL (ref 6.0–8.3)

## 2013-06-27 LAB — LIPID PANEL
Cholesterol: 126 mg/dL (ref 0–200)
HDL: 41 mg/dL (ref >39)
LDL Cholesterol: 73 mg/dL (ref 0–99)
Total CHOL/HDL Ratio: 3.1 Ratio
Triglycerides: 59 mg/dL (ref <150)
VLDL: 12 mg/dL (ref 0–40)

## 2013-06-27 NOTE — Progress Notes (Signed)
c 

## 2013-06-27 NOTE — Progress Notes (Signed)
73 yo alcoholic gentleman with h/o cirrhosis.  Now dry for 9 months.  He is having vegetables and gaining weight.  He still has achy joints.  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  Objective:  Alert, cooperative and friendly No icterus No asterixis Neck:  No JVD Chest: clear Heart:  Occasionally irregular (every 20 seconds), no murmur or gallop Abdomen:  No HSM, no bloating or ascites, nontender Extrem:  No edema, no Heberden's nodes. Skin:  Few areas of purpura.  Wt Readings from Last 3 Encounters:  06/27/13 139 lb 6.4 oz (63.231 kg)  03/10/13 136 lb 12.8 oz (62.052 kg)  12/08/12 126 lb 6.4 oz (57.335 kg)   BP Readings from Last 3 Encounters:  06/27/13 140/80  03/10/13 120/80  12/08/12 108/62    Assessment:  The alcoholism is in remission and the weight gain looks great.  Plan:  Check CMET, lipids Continue prn diuretics Mail results   Aili Casillas, MD

## 2013-12-26 ENCOUNTER — Ambulatory Visit: Payer: Medicare PPO | Admitting: Family Medicine

## 2014-03-17 DIAGNOSIS — H2511 Age-related nuclear cataract, right eye: Secondary | ICD-10-CM | POA: Diagnosis not present

## 2014-03-17 DIAGNOSIS — H2512 Age-related nuclear cataract, left eye: Secondary | ICD-10-CM | POA: Diagnosis not present

## 2014-04-17 DIAGNOSIS — H2511 Age-related nuclear cataract, right eye: Secondary | ICD-10-CM | POA: Diagnosis not present

## 2014-05-23 DIAGNOSIS — Z961 Presence of intraocular lens: Secondary | ICD-10-CM | POA: Diagnosis not present

## 2014-05-23 DIAGNOSIS — H2512 Age-related nuclear cataract, left eye: Secondary | ICD-10-CM | POA: Diagnosis not present

## 2014-05-25 DIAGNOSIS — H2512 Age-related nuclear cataract, left eye: Secondary | ICD-10-CM | POA: Diagnosis not present

## 2014-06-30 ENCOUNTER — Encounter: Payer: Self-pay | Admitting: Gastroenterology

## 2014-10-04 DIAGNOSIS — R609 Edema, unspecified: Secondary | ICD-10-CM | POA: Diagnosis not present

## 2014-10-04 DIAGNOSIS — M1990 Unspecified osteoarthritis, unspecified site: Secondary | ICD-10-CM | POA: Diagnosis not present

## 2014-10-04 DIAGNOSIS — R972 Elevated prostate specific antigen [PSA]: Secondary | ICD-10-CM | POA: Diagnosis not present

## 2014-10-04 DIAGNOSIS — E538 Deficiency of other specified B group vitamins: Secondary | ICD-10-CM | POA: Diagnosis not present

## 2015-02-22 IMAGING — CT CT ABD-PELV W/ CM
2 of 5 series · 17 of 46 positions shown, 19 images · IV contrast (omnipaque)
Comparison: None.

CLINICAL DATA: Bloating, abdominal pain

CT ABDOMEN AND PELVIS WITH CONTRAST
TECHNIQUE: Multidetector CT imaging of the abdomen and pelvis was
performed following the standard protocol during bolus
administration of intravenous contrast.
Contrast: 100mL OMNIPAQUE IOHEXOL 300 MG/ML  SOLN

[Series 2: abd/pelvis with · axial · 0.70mm/px · z∈[-426,+19]mm · 14 of 101 slices shown, 16 images]
[im 6/101  soft-tissue]
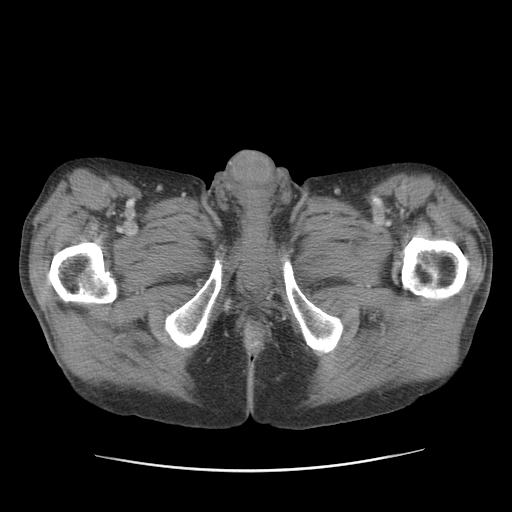
[im 6/101  bone]
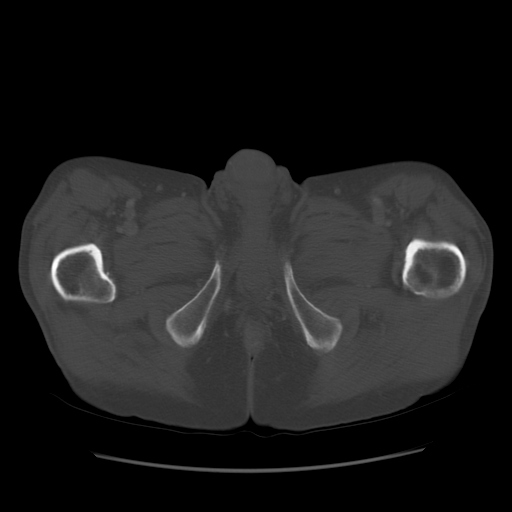
[im 11/101  soft-tissue]
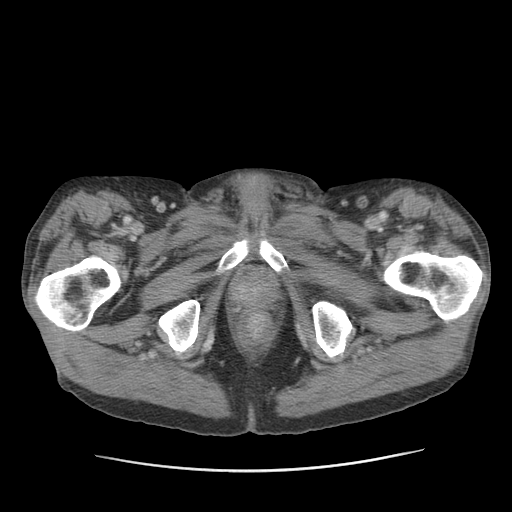
[im 22/101  soft-tissue]
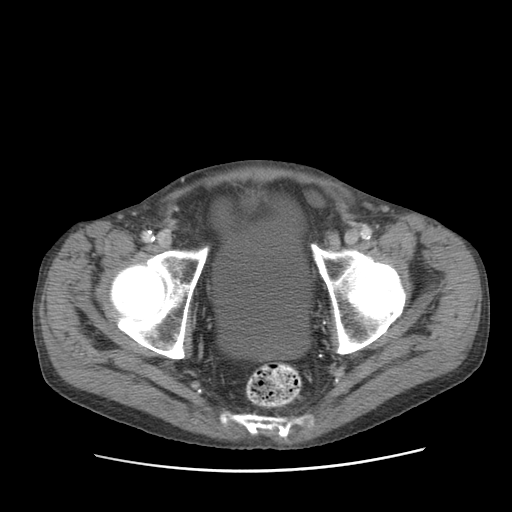
[im 27/101  soft-tissue]
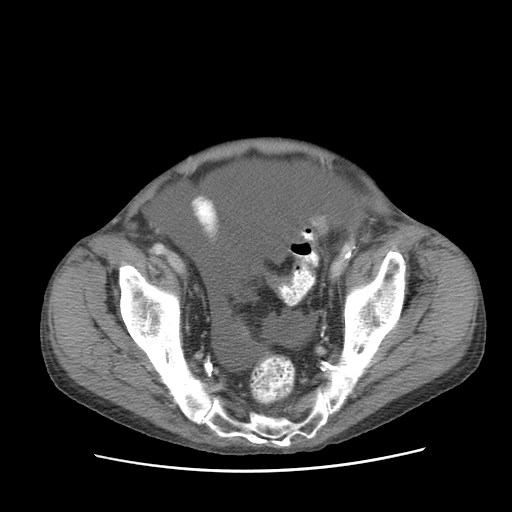
[im 32/101  soft-tissue]
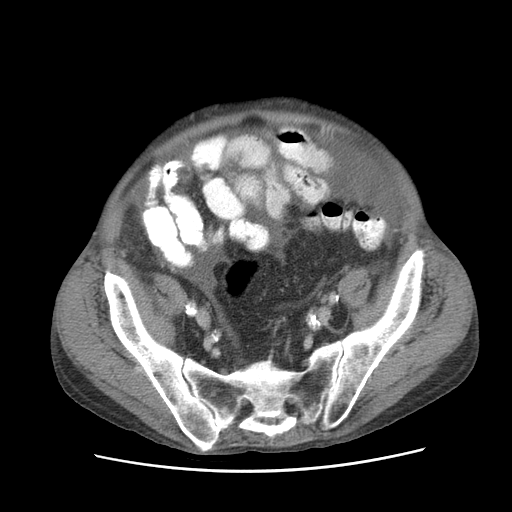
[im 43/101  soft-tissue]
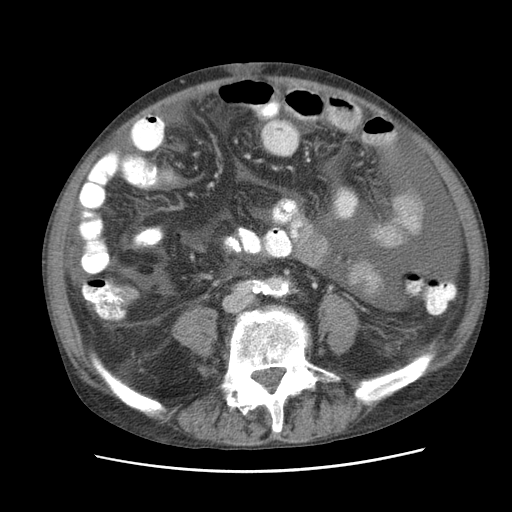
[im 48/101  soft-tissue]
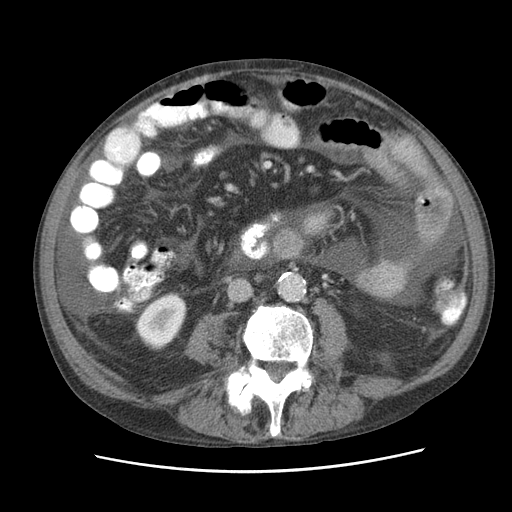
[im 53/101  soft-tissue]
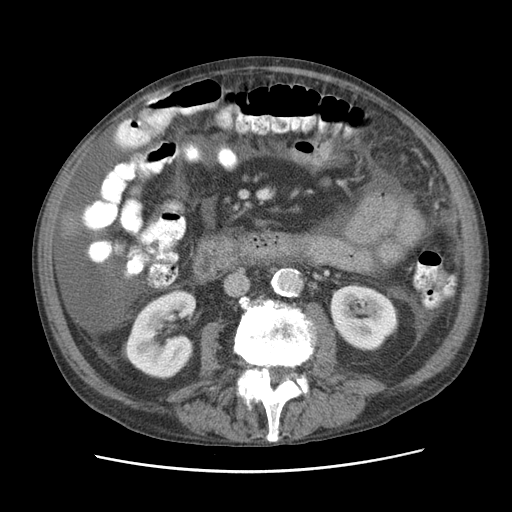
[im 58/101  soft-tissue]
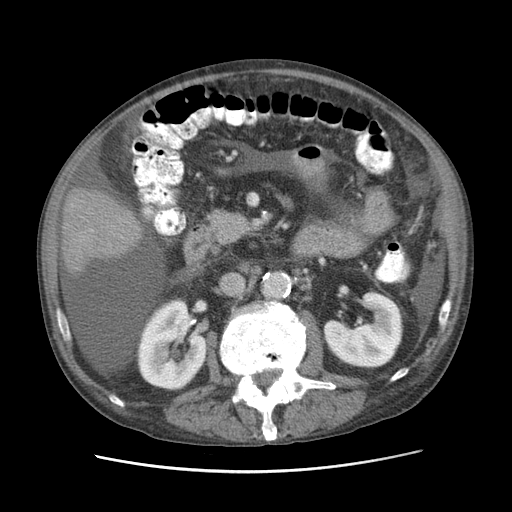
[im 58/101  bone]
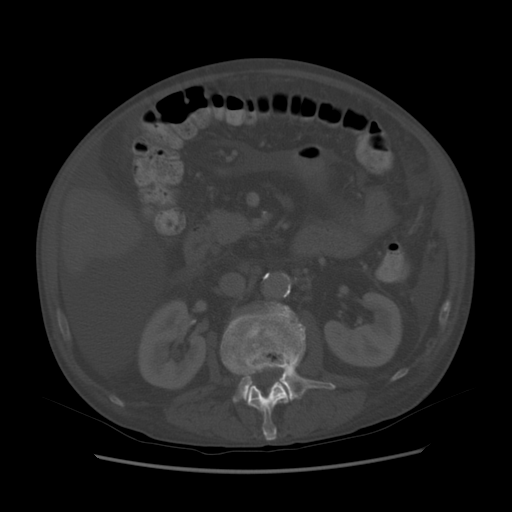
[im 69/101  soft-tissue]
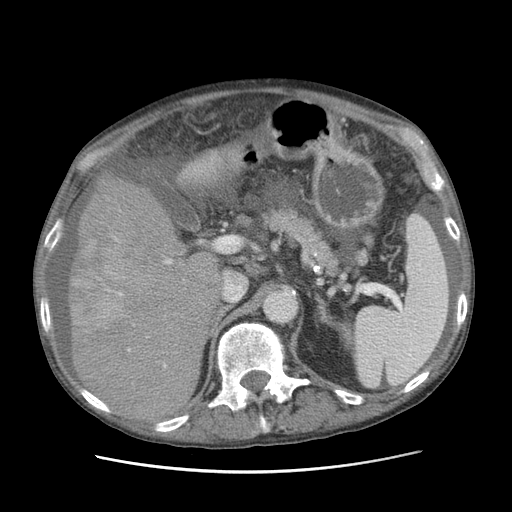
[im 74/101  soft-tissue]
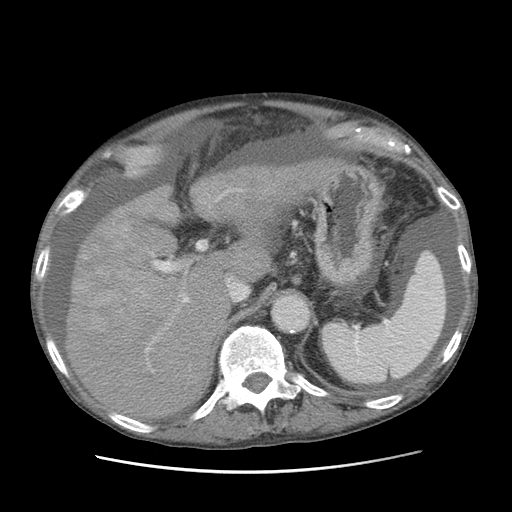
[im 79/101  soft-tissue]
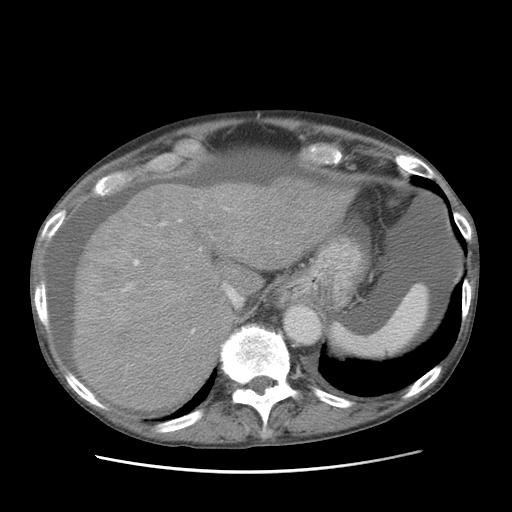
[im 90/101  soft-tissue]
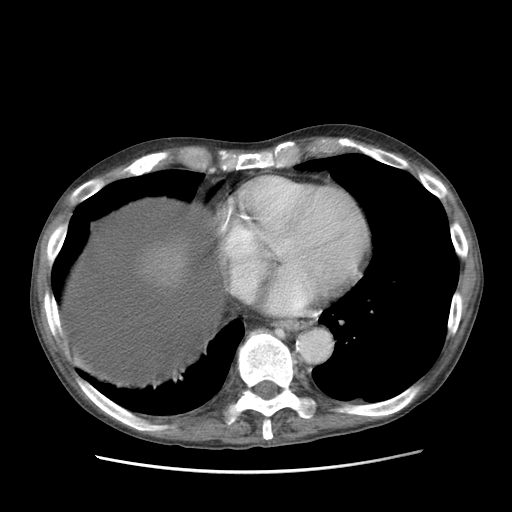
[im 95/101  soft-tissue]
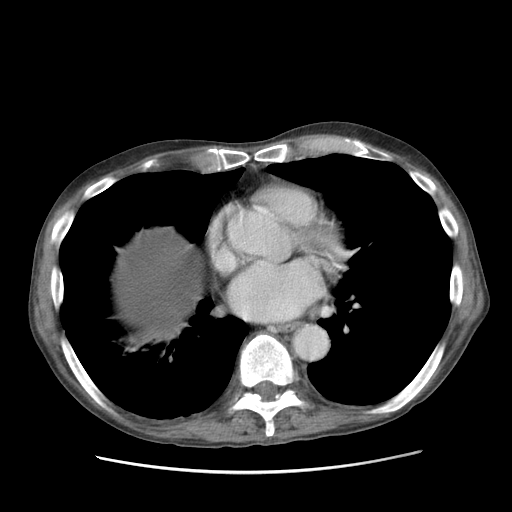

[Series 401: coronal · coronal · 1.00mm/px · 3 of 115 slices shown]
[im 39/115  soft-tissue]
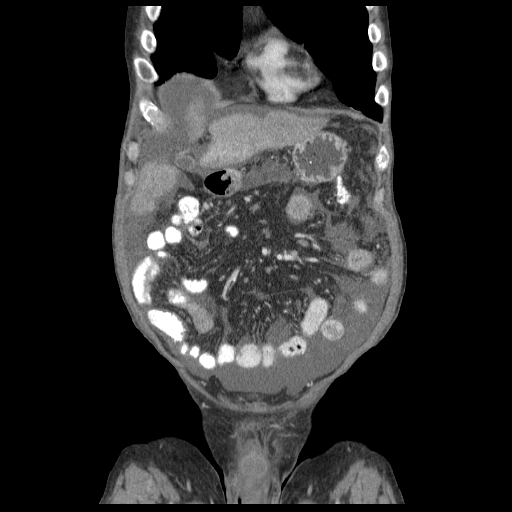
[im 51/115  soft-tissue]
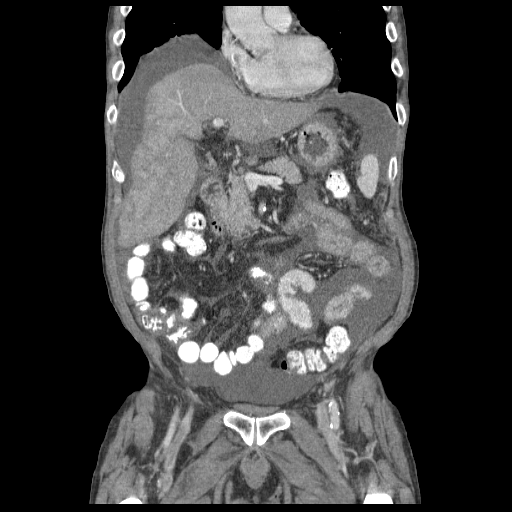
[im 64/115  soft-tissue]
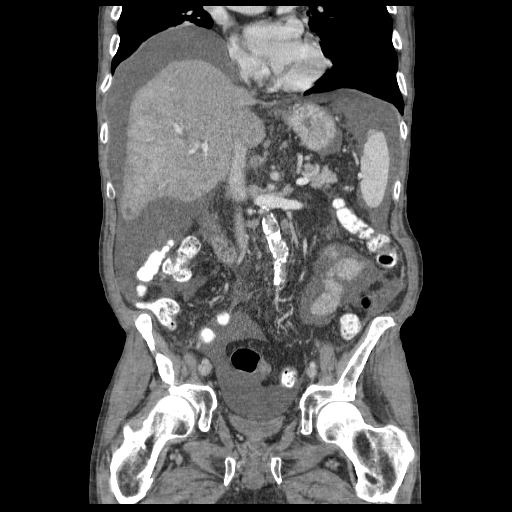

[17 of 46 positions shown; findings below may reference images not displayed]

FINDINGS: Mild atelectasis versus scarring in the right lung base.

Tiny hiatal hernia.

Cirrhotic configuration of the liver.  Heterogeneous enhancement
predominantly involving the anterior segment right hepatic lobe
(series 2/images 29 and 36).  12 mm fluid density lesion in the
inferior right hepatic lobe (series 2/image 42), likely reflecting
a benign cyst.

Spleen is normal in size.

Pancreas and adrenal glands are within normal limits.

Gallbladder is underdistended.  No intrahepatic or extrahepatic
ductal dilatation.

Kidneys are notable for bilateral renal vascular calcifications but
are otherwise unremarkable.  No hydronephrosis.

No evidence of bowel obstruction.  Normal appendix.

Atherosclerotic calcifications of the abdominal aorta and branch
vessels.  Perigastric varices.  Portal vein is patent.

Moderate abdominopelvic ascites.

Mild upper abdominal lymphadenopathy, including a 14 mm short axis
portacaval node (series 2/image 37), likely reactive.

Prostate is unremarkable.

Bladder is underdistended.

Degenerative changes of the visualized thoracolumbar spine.
IMPRESSION: Cirrhosis with perigastric varices and moderate abdominopelvic
ascites.  Portal vein is patent.

Heterogeneous enhancement predominately involving the anterior
segment right hepatic lobe.  Infiltrating HCC is not excluded.
Correlate with serum AFP and consider MRI abdomen with/without
contrast for further evaluation.

## 2019-09-11 DEATH — deceased
